# Patient Record
Sex: Male | Born: 1965 | Hispanic: No | Marital: Married
Health system: Southern US, Community
[De-identification: ages and names within clinical notes are randomized; demographics above are authoritative.]

---

## 2013-06-25 ENCOUNTER — Ambulatory Visit
Admission: RE | Admit: 2013-06-25 | Discharge: 2013-06-25 | Disposition: A | Discharge status: Home / Self Care | Payer: BC Managed Care – PPO | Source: Ambulatory Visit | Attending: Internal Medicine | Admitting: Internal Medicine

## 2013-06-25 ENCOUNTER — Other Ambulatory Visit: Payer: Self-pay | Admitting: Internal Medicine

## 2013-06-25 DIAGNOSIS — M79609 Pain in unspecified limb: Secondary | ICD-10-CM

## 2017-07-09 ENCOUNTER — Ambulatory Visit
Admission: RE | Admit: 2017-07-09 | Discharge: 2017-07-09 | Disposition: A | Discharge status: Home / Self Care | Payer: No Typology Code available for payment source | Source: Ambulatory Visit | Attending: Internal Medicine | Admitting: Internal Medicine

## 2017-07-09 ENCOUNTER — Other Ambulatory Visit: Payer: Self-pay | Admitting: Internal Medicine

## 2017-07-09 DIAGNOSIS — M25562 Pain in left knee: Secondary | ICD-10-CM

## 2018-10-25 ENCOUNTER — Other Ambulatory Visit: Payer: Self-pay | Admitting: Internal Medicine

## 2018-10-25 DIAGNOSIS — E782 Mixed hyperlipidemia: Secondary | ICD-10-CM

## 2018-10-25 DIAGNOSIS — E785 Hyperlipidemia, unspecified: Secondary | ICD-10-CM

## 2018-11-15 ENCOUNTER — Ambulatory Visit
Admission: RE | Admit: 2018-11-15 | Discharge: 2018-11-15 | Disposition: A | Discharge status: Home / Self Care | Payer: No Typology Code available for payment source | Source: Ambulatory Visit | Attending: Internal Medicine | Admitting: Internal Medicine

## 2018-11-15 DIAGNOSIS — E782 Mixed hyperlipidemia: Secondary | ICD-10-CM

## 2018-11-15 DIAGNOSIS — E785 Hyperlipidemia, unspecified: Secondary | ICD-10-CM

## 2019-04-19 IMAGING — DX DG KNEE COMPLETE 4+V*L*
4 series · 4 of 4 positions shown · non-contrast
Comparison: None.

CLINICAL DATA: Acute left knee pain

EXAM:
LEFT KNEE - COMPLETE 4+ VIEW

[dg knee complete 4 views left (1 of 4)]
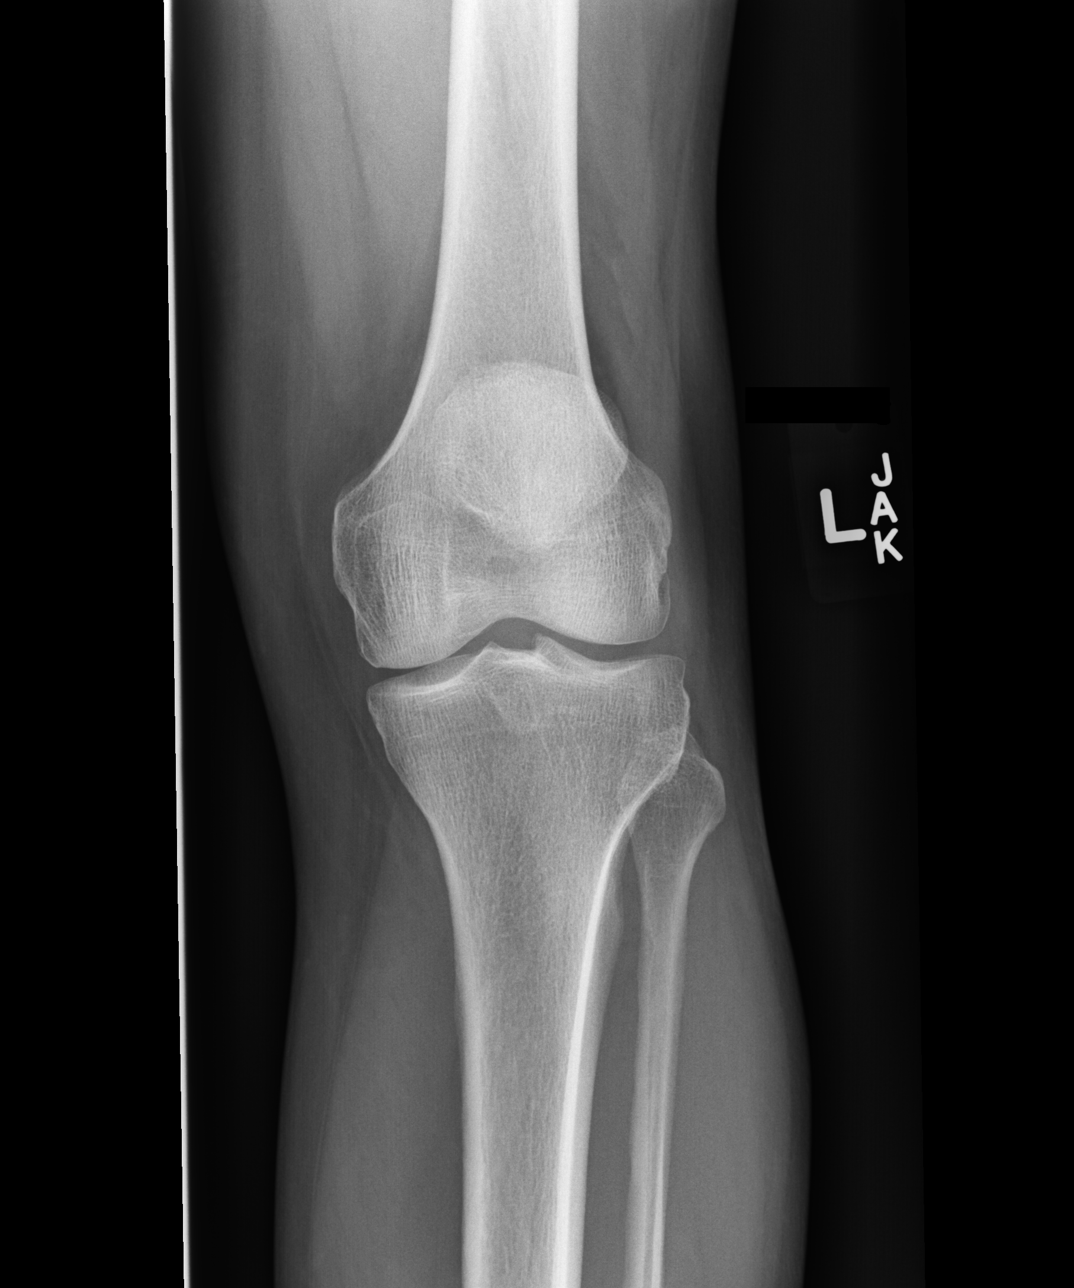

[dg knee complete 4 views left (2 of 4)]
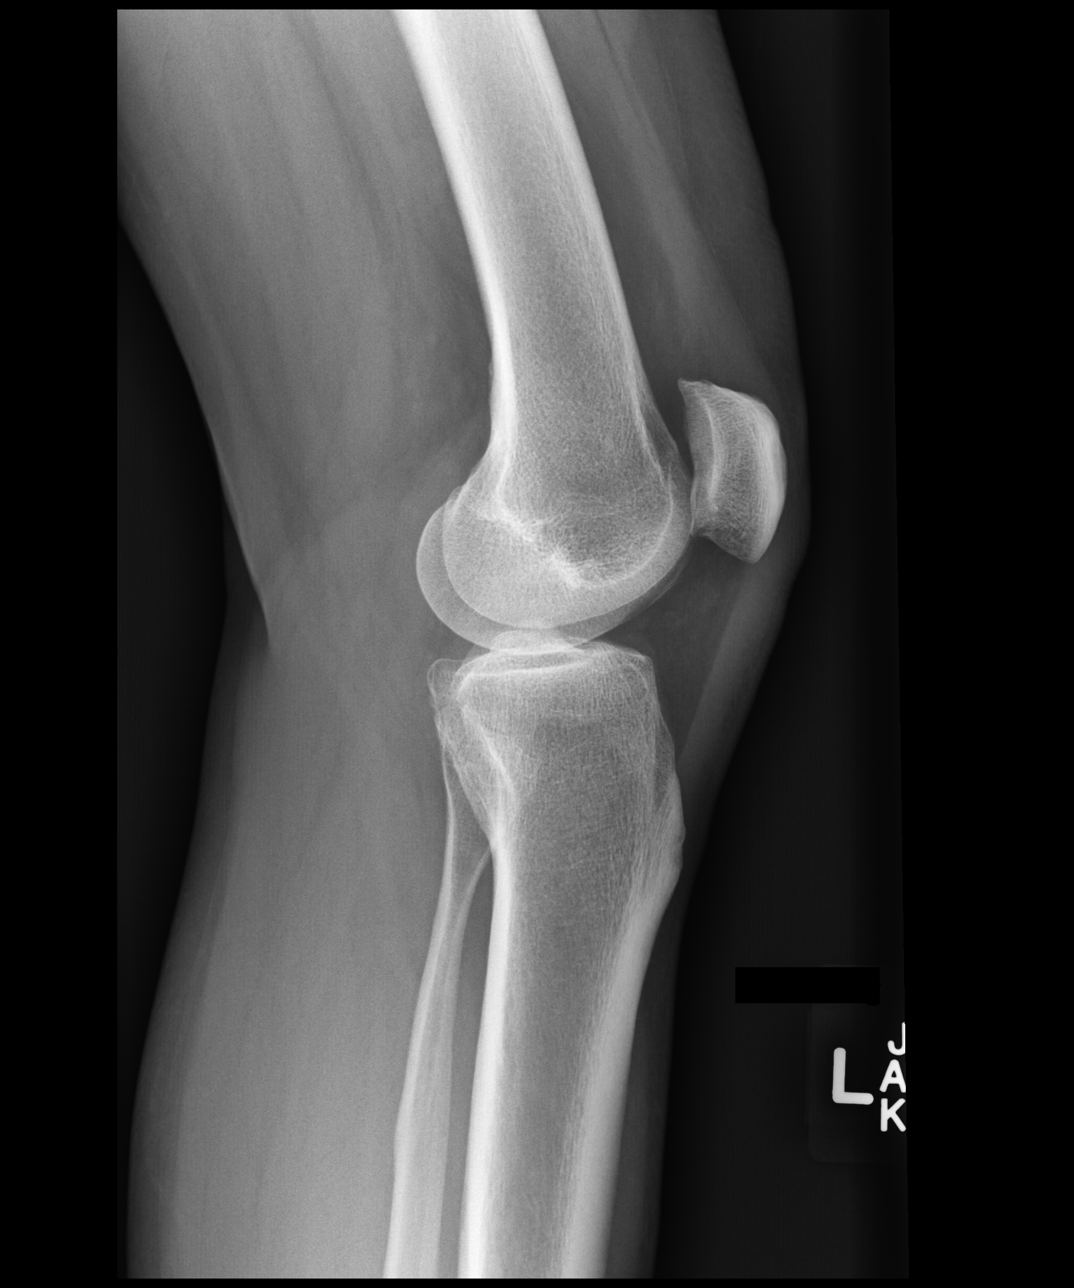

[dg knee complete 4 views left (3 of 4)]
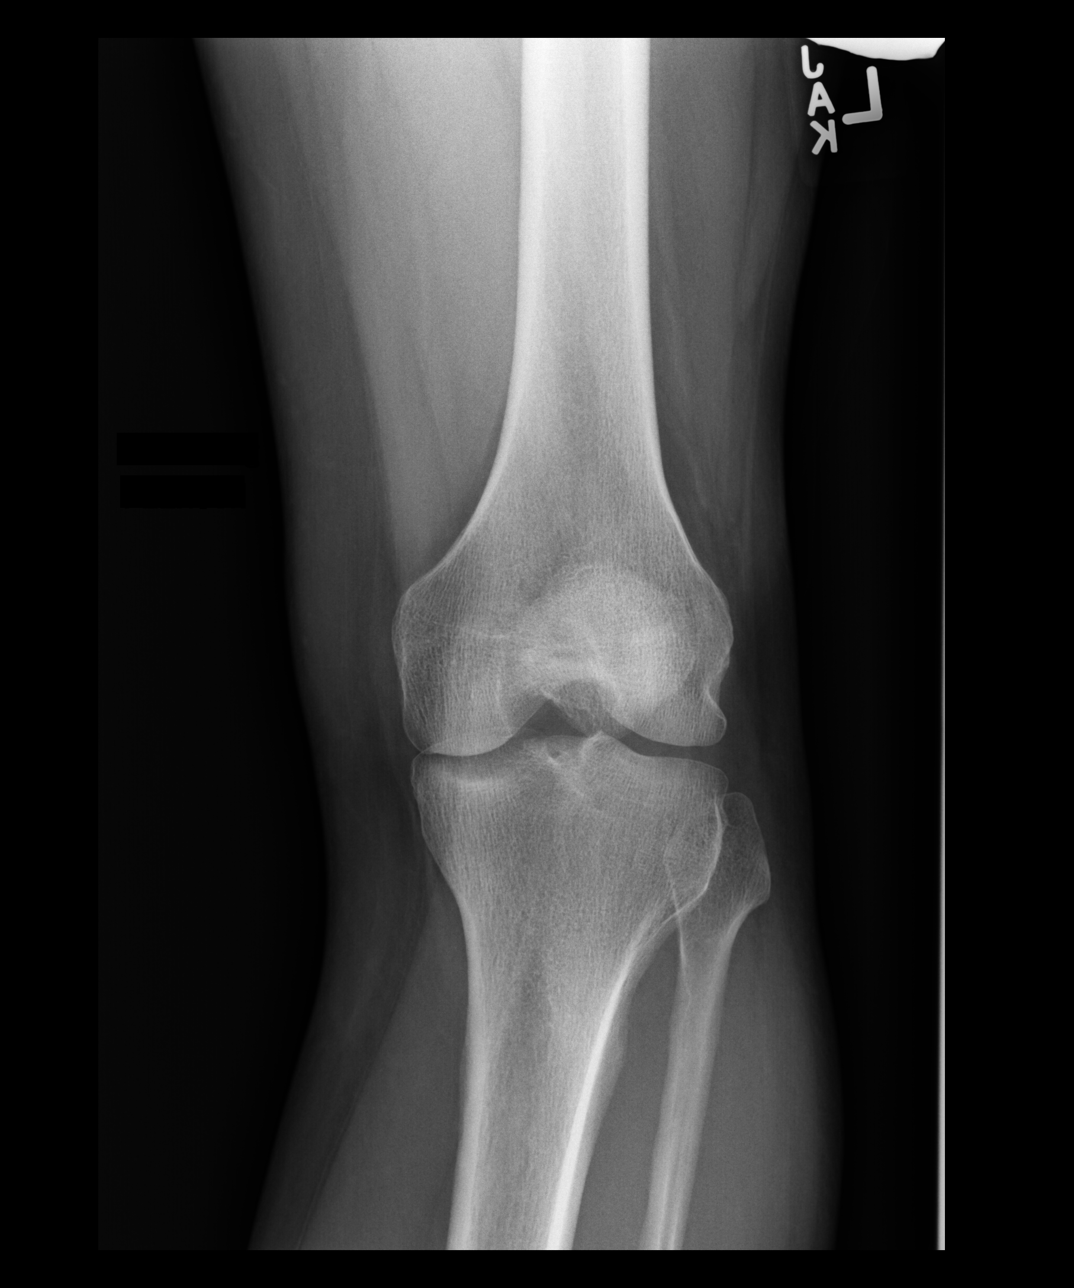

[dg knee complete 4 views left (4 of 4)]
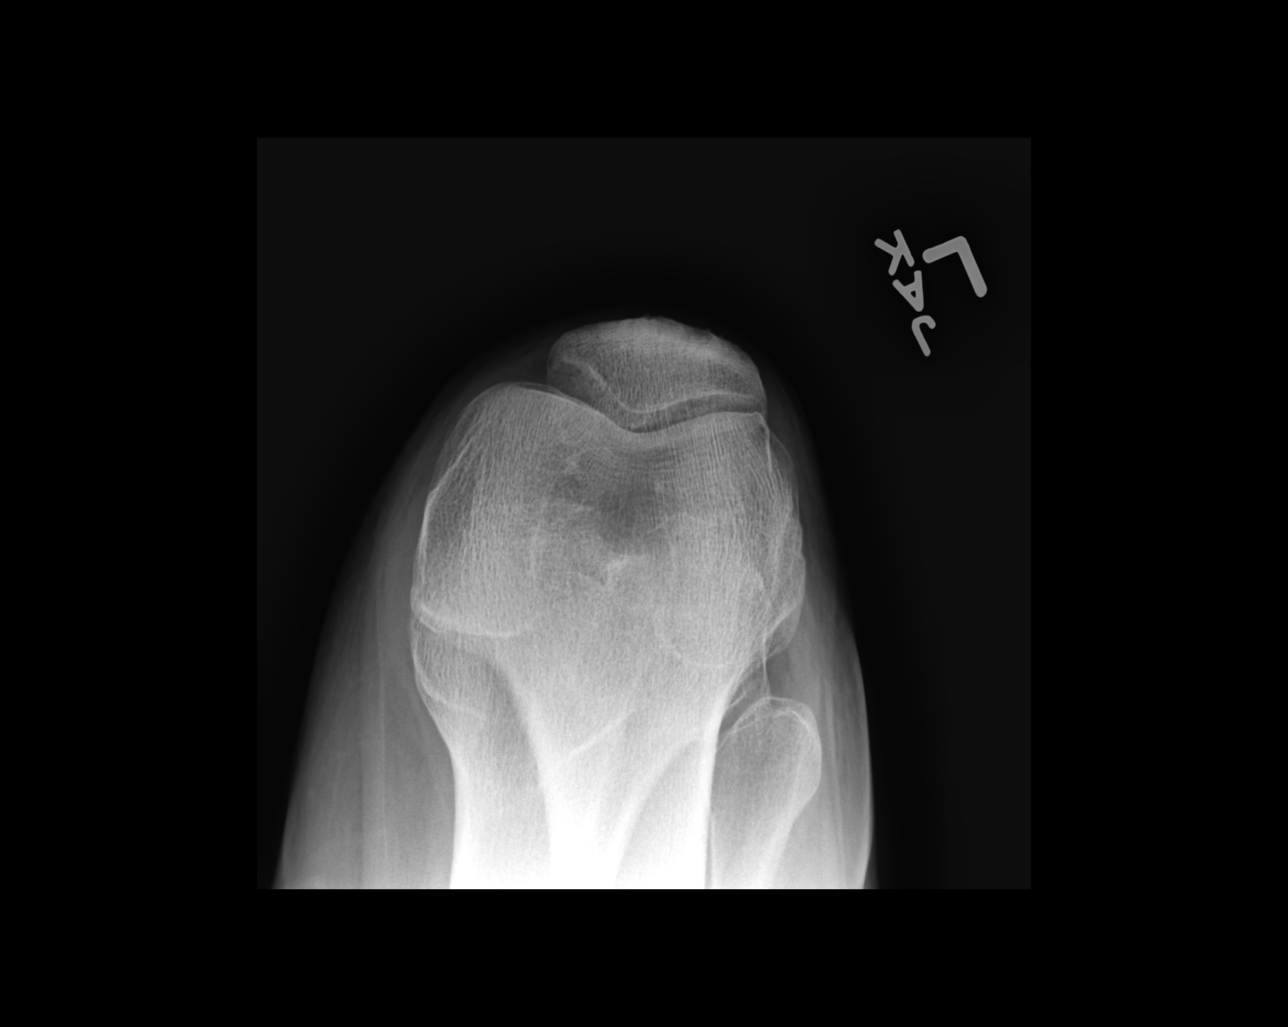

[4 of 4 positions shown; findings below may reference images not displayed]

FINDINGS: No evidence of fracture, dislocation, or joint effusion. No evidence
of arthropathy or other focal bone abnormality. Soft tissues are
unremarkable.
IMPRESSION: Negative.

## 2020-01-07 ENCOUNTER — Ambulatory Visit: Payer: Self-pay | Attending: Internal Medicine

## 2020-01-07 ENCOUNTER — Other Ambulatory Visit (HOSPITAL_COMMUNITY): Payer: Self-pay | Admitting: Internal Medicine

## 2020-01-07 DIAGNOSIS — Z23 Encounter for immunization: Secondary | ICD-10-CM

## 2020-01-07 NOTE — Progress Notes (Signed)
   Covid-[facility] Vaccination [facility]  Name:  Tommy Shelton    MRN: [id number] DOB: 03-15-1966  01/07/2020  Tommy Shelton was observed post Covid-19 immunization for 15 minutes without incident. He was provided with Vaccine Information Sheet and instruction to access the V-Safe system.   Tommy Shelton was instructed to call 911 with any severe reactions post vaccine: Marland Kitchen Difficulty breathing  . Swelling of face and throat  . A fast heartbeat  . A bad rash all over body  . Dizziness and weakness

## 2020-07-01 ENCOUNTER — Other Ambulatory Visit (HOSPITAL_COMMUNITY): Payer: Self-pay | Admitting: Emergency Medicine

## 2020-07-08 ENCOUNTER — Ambulatory Visit (HOSPITAL_COMMUNITY)
Admission: RE | Admit: 2020-07-08 | Discharge: 2020-07-08 | Disposition: A | Discharge status: Home / Self Care | Payer: Self-pay | Source: Ambulatory Visit | Attending: Cardiology | Admitting: Cardiology

## 2020-08-18 ENCOUNTER — Ambulatory Visit: Payer: Self-pay | Attending: Internal Medicine

## 2020-08-18 DIAGNOSIS — Z23 Encounter for immunization: Secondary | ICD-10-CM

## 2020-08-18 NOTE — Progress Notes (Signed)
   Covid-19 Vaccination Clinic  Name:  Tommy Shelton    MRN: [id number] DOB: 1965/06/09  08/18/2020  Mr. Tommy Shelton was observed post Covid-19 immunization for 15 minutes without incident. He was provided with Vaccine Information Sheet and instruction to access the V-Safe system.   Mr. Tommy Shelton was instructed to call 911 with any severe reactions post vaccine: Marland Kitchen Difficulty breathing  . Swelling of face and throat  . A fast heartbeat  . A bad rash all over body  . Dizziness and weakness   Immunizations Administered    Name Date Dose VIS Date Route   PFIZER Comrnaty(Gray TOP) Covid-19 Vaccine 08/18/2020 12:06 PM 0.3 mL 03/11/2020 Intramuscular   Manufacturer: [employer], [employer]   Lot: [id number]   NDC: [phone]

## 2020-08-20 ENCOUNTER — Other Ambulatory Visit (HOSPITAL_COMMUNITY): Payer: Self-pay

## 2020-08-20 MED ORDER — COVID-19 MRNA VAC-TRIS(PFIZER) 30 MCG/0.3ML IM SUSP
INTRAMUSCULAR | 0 refills | Status: AC
  Filled 2020-08-20: qty 0.3, 17d supply, fill #0

## 2020-08-24 ENCOUNTER — Other Ambulatory Visit (HOSPITAL_COMMUNITY): Payer: Self-pay

## 2021-01-21 ENCOUNTER — Ambulatory Visit: Payer: Self-pay

## 2021-01-21 ENCOUNTER — Other Ambulatory Visit (HOSPITAL_BASED_OUTPATIENT_CLINIC_OR_DEPARTMENT_OTHER): Payer: Self-pay

## 2021-01-21 MED ORDER — PFIZER COVID-19 VAC BIVALENT 30 MCG/0.3ML IM SUSP
INTRAMUSCULAR | 0 refills | Status: DC
  Filled 2021-01-21: qty 0.3, 1d supply, fill #0

## 2021-03-18 ENCOUNTER — Other Ambulatory Visit (HOSPITAL_BASED_OUTPATIENT_CLINIC_OR_DEPARTMENT_OTHER): Payer: Self-pay

## 2022-01-11 ENCOUNTER — Other Ambulatory Visit: Payer: Self-pay

## 2022-01-11 MED ORDER — COVID-19 MRNA 2023-2024 VACCINE (COMIRNATY) 0.3 ML INJECTION
0.3000 mL | Freq: Once | INTRAMUSCULAR | 0 refills | Status: AC
  Filled 2022-01-11: qty 0.3, 1d supply, fill #0

## 2022-01-12 ENCOUNTER — Other Ambulatory Visit: Payer: Self-pay
